# Patient Record
Sex: Female | Born: 1965 | Race: White | Hispanic: No | Marital: Single | Smoking: Never smoker
Health system: Southern US, Community
[De-identification: ages and names within clinical notes are randomized; demographics above are authoritative.]

## PROBLEM LIST (undated history)

## (undated) DIAGNOSIS — F329 Major depressive disorder, single episode, unspecified: Secondary | ICD-10-CM

## (undated) DIAGNOSIS — F32A Depression, unspecified: Secondary | ICD-10-CM

---

## 1999-07-13 ENCOUNTER — Emergency Department (HOSPITAL_COMMUNITY): Admission: EM | Admit: 1999-07-13 | Discharge: 1999-07-13 | Payer: Self-pay | Admitting: Emergency Medicine

## 2000-07-16 ENCOUNTER — Other Ambulatory Visit: Admission: RE | Admit: 2000-07-16 | Discharge: 2000-07-16 | Payer: Self-pay | Admitting: *Deleted

## 2001-08-11 ENCOUNTER — Other Ambulatory Visit: Admission: RE | Admit: 2001-08-11 | Discharge: 2001-08-11 | Payer: Self-pay | Admitting: Obstetrics and Gynecology

## 2002-09-27 ENCOUNTER — Other Ambulatory Visit: Admission: RE | Admit: 2002-09-27 | Discharge: 2002-09-27 | Payer: Self-pay | Admitting: Obstetrics and Gynecology

## 2009-03-09 ENCOUNTER — Emergency Department (HOSPITAL_COMMUNITY): Admission: EM | Admit: 2009-03-09 | Discharge: 2009-03-09 | Payer: Self-pay | Admitting: Emergency Medicine

## 2009-12-21 ENCOUNTER — Emergency Department (HOSPITAL_COMMUNITY): Admission: EM | Admit: 2009-12-21 | Discharge: 2009-12-21 | Payer: Self-pay | Admitting: Emergency Medicine

## 2010-02-01 ENCOUNTER — Emergency Department (HOSPITAL_COMMUNITY): Admission: EM | Admit: 2010-02-01 | Discharge: 2010-02-01 | Payer: Self-pay | Admitting: Emergency Medicine

## 2010-10-14 LAB — COMPREHENSIVE METABOLIC PANEL
ALT: 30 U/L (ref 0–35)
AST: 25 U/L (ref 0–37)
Alkaline Phosphatase: 62 U/L (ref 39–117)
Creatinine, Ser: 0.95 mg/dL (ref 0.4–1.2)
GFR calc non Af Amer: >60 mL/min (ref >60)
Glucose, Bld: 136 mg/dL — ABNORMAL HIGH (ref 70–99)
Total Protein: 7.7 g/dL (ref 6.0–8.3)

## 2010-10-14 LAB — CBC
HCT: 37.6 % (ref 36.0–46.0)
Hemoglobin: 13.1 g/dL (ref 12.0–15.0)
MCV: 87.2 fL (ref 78.0–100.0)
Platelets: 168 10*3/uL (ref 150–400)
RBC: 4.31 MIL/uL (ref 3.87–5.11)
WBC: 21.7 10*3/uL — ABNORMAL HIGH (ref 4.0–10.5)

## 2010-10-14 LAB — URINE MICROSCOPIC-ADD ON

## 2010-10-14 LAB — URINALYSIS, ROUTINE W REFLEX MICROSCOPIC
Ketones, ur: >80 mg/dL — AB
Protein, ur: 100 mg/dL — AB
Specific Gravity, Urine: 1.026 (ref 1.005–1.030)
pH: 5.5 (ref 5.0–8.0)

## 2010-10-14 LAB — URINE CULTURE

## 2010-10-14 LAB — DIFFERENTIAL
Basophils Absolute: 0 10*3/uL (ref 0.0–0.1)
Monocytes Absolute: 1.1 10*3/uL — ABNORMAL HIGH (ref 0.1–1.0)
Neutro Abs: 19.9 10*3/uL — ABNORMAL HIGH (ref 1.7–7.7)
Neutrophils Relative %: 92 % — ABNORMAL HIGH (ref 43–77)

## 2010-10-14 LAB — PREGNANCY, URINE: Preg Test, Ur: NEGATIVE

## 2011-11-27 IMAGING — CT CT ABD-PELV W/O CM
1 of 2 series · 16 of 32 positions shown, 20 images · non-contrast
Comparison: None.

CLINICAL DATA: Left flank and left lower quadrant abdominal pain.
Currently being treated for urinary tract infection.  Hematuria.

CT ABDOMEN AND PELVIS WITHOUT CONTRAST
TECHNIQUE: Multidetector CT imaging of the abdomen and pelvis was
performed following the standard protocol without intravenous
contrast.

[Series 2: under 200# stone no prev · axial · 0.74mm/px · z∈[-460,-120]mm · 16 of 74 slices shown, 20 images]
[im 3/74  soft-tissue]
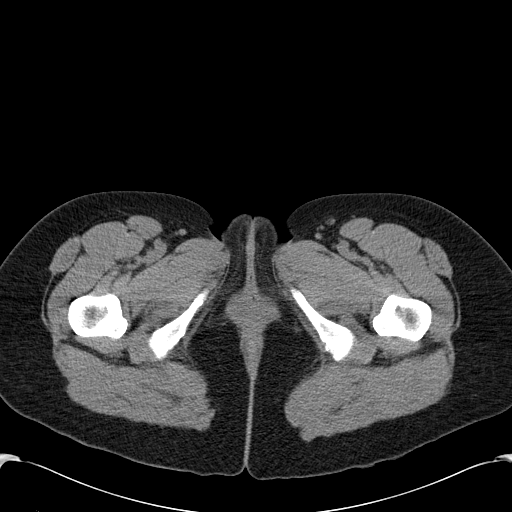
[im 3/74  bone]
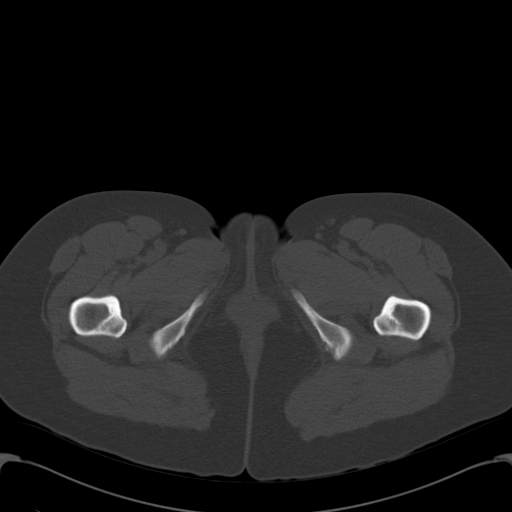
[im 9/74  soft-tissue]
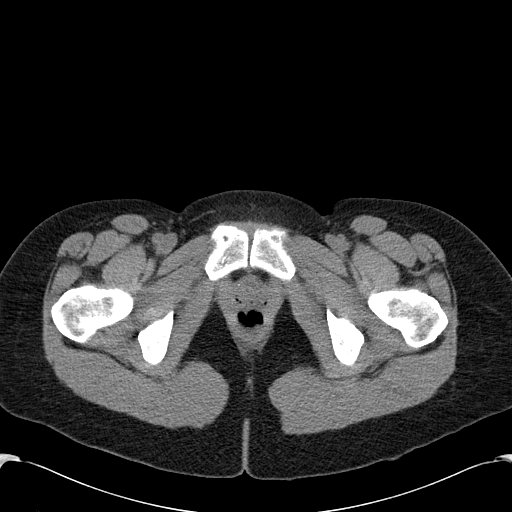
[im 15/74  soft-tissue]
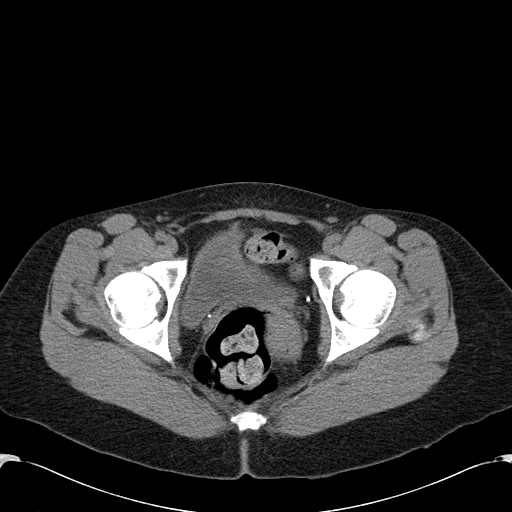
[im 21/74  soft-tissue]
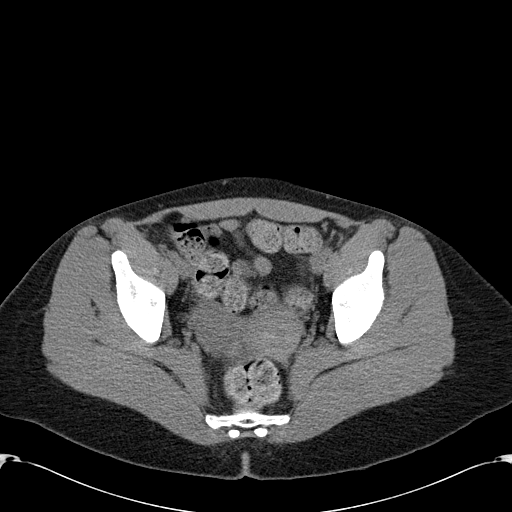
[im 24/74  soft-tissue]
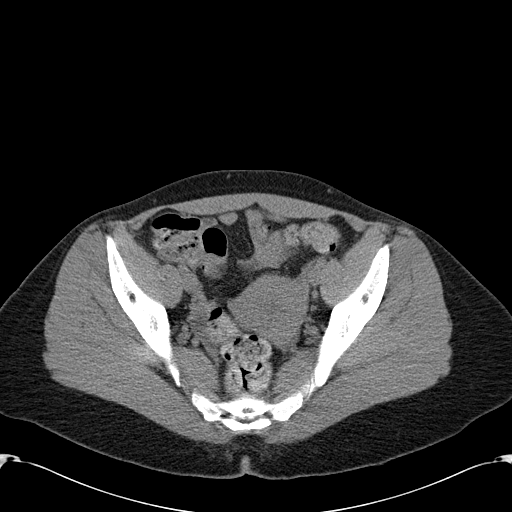
[im 30/74  soft-tissue]
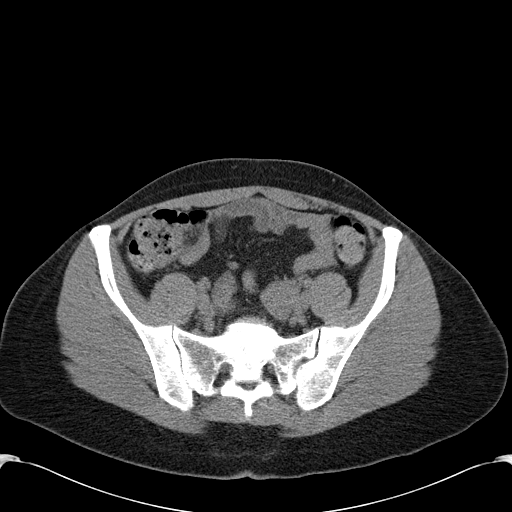
[im 36/74  soft-tissue]
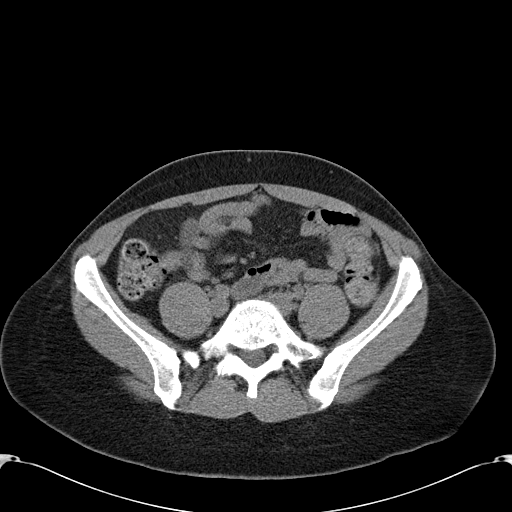
[im 38/74  soft-tissue]
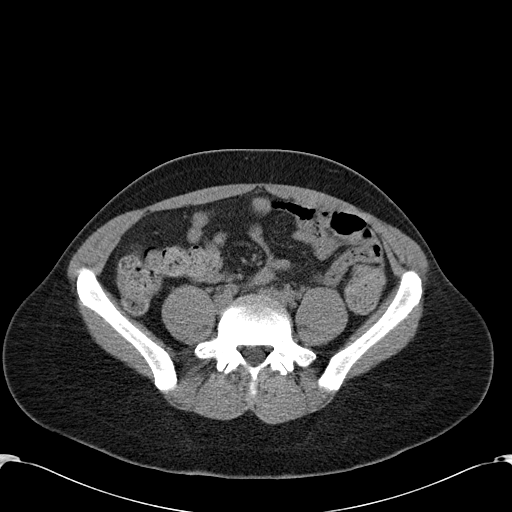
[im 44/74  soft-tissue]
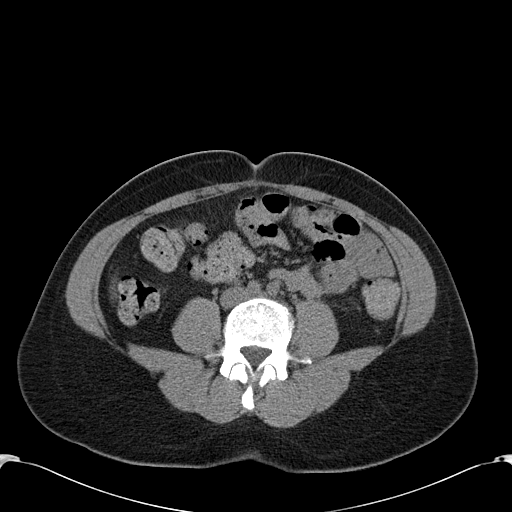
[im 44/74  bone]
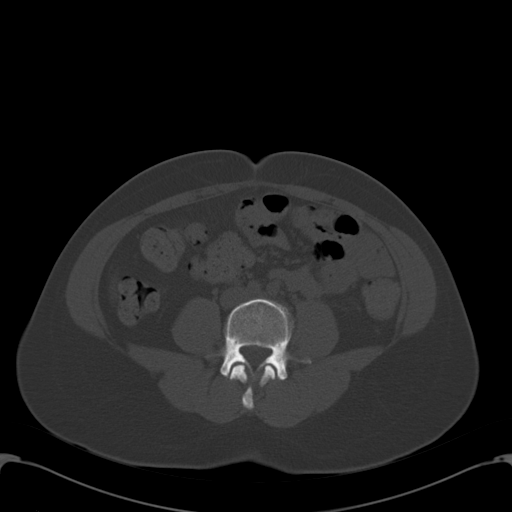
[im 50/74  soft-tissue]
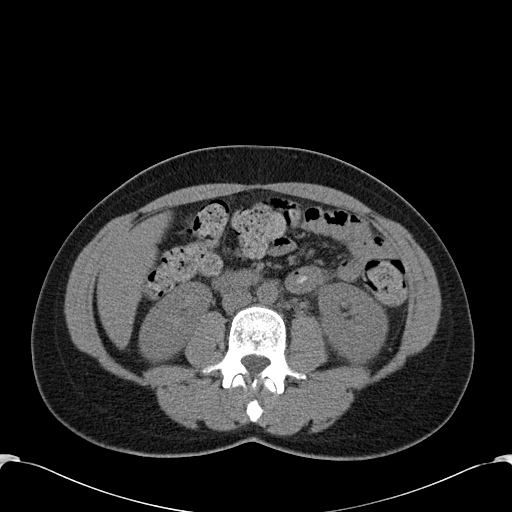
[im 56/74  soft-tissue]
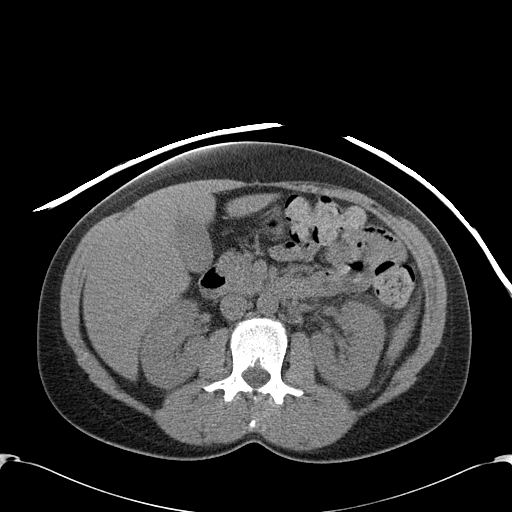
[im 59/74  soft-tissue]
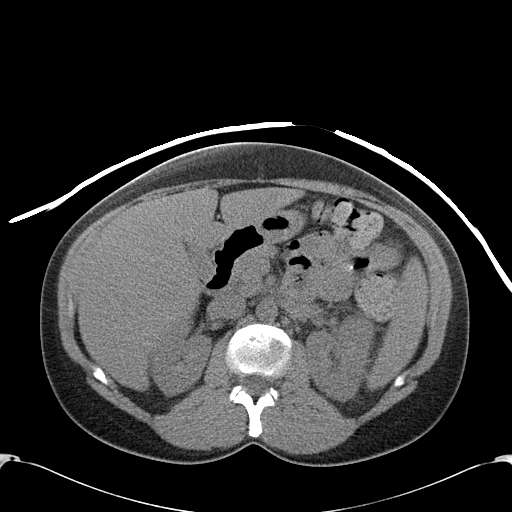
[im 62/74  lung]
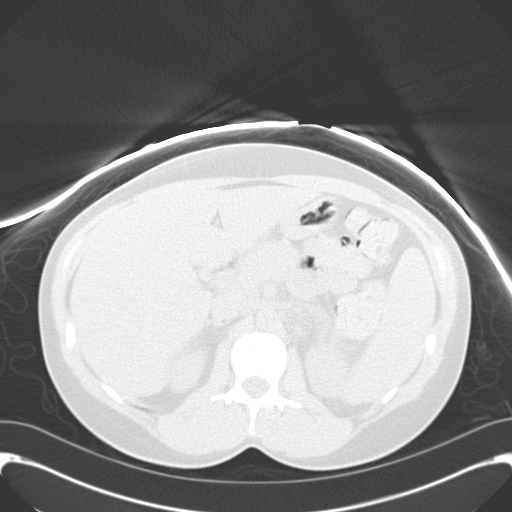
[im 65/74  soft-tissue]
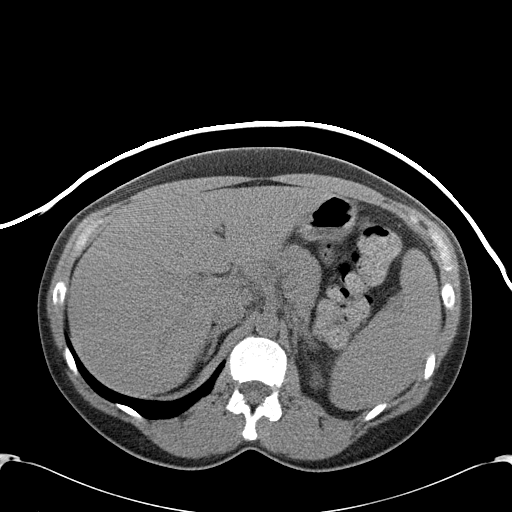
[im 65/74  lung]
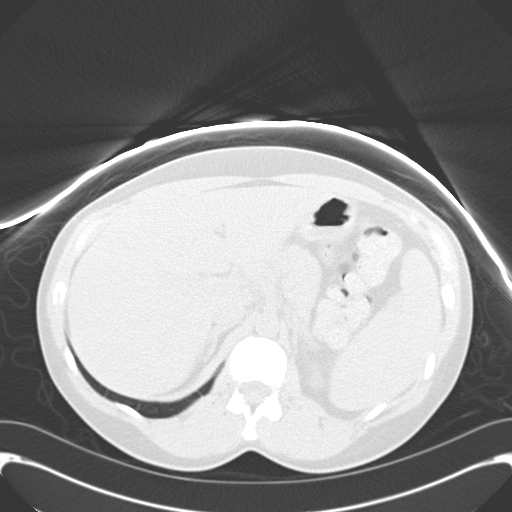
[im 68/74  lung]
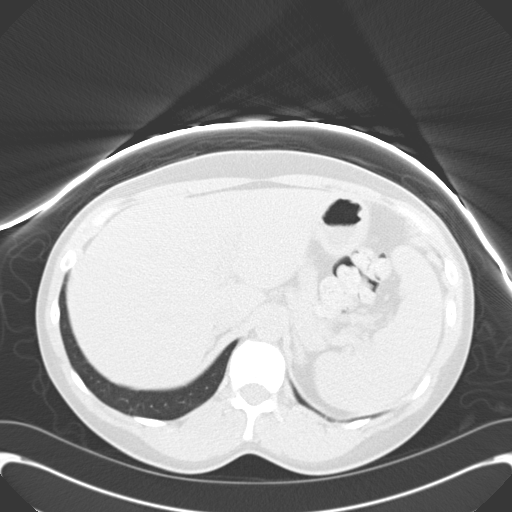
[im 71/74  soft-tissue]
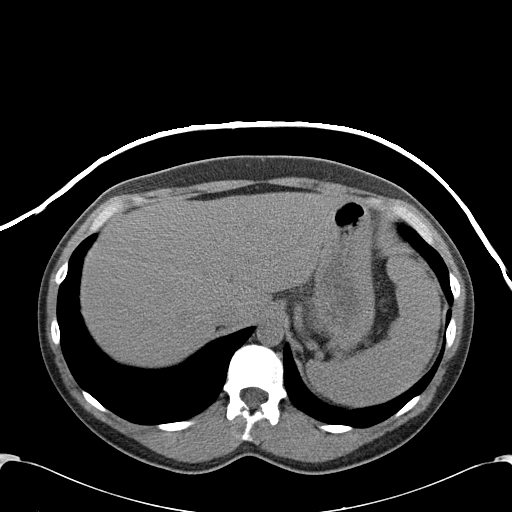
[im 71/74  lung]
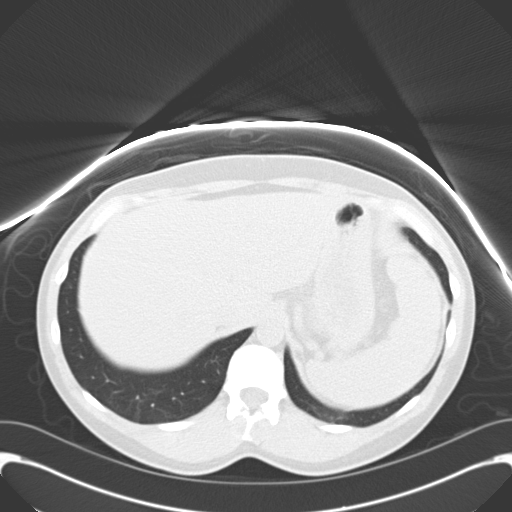

[16 of 32 positions shown; findings below may reference images not displayed]

FINDINGS: There is slight left perinephric soft tissue stranding
which could be seen with pyelonephritis.  There are no renal or
ureteral calculi.  There is no hydronephrosis.

The visualized portions of the liver and spleen are normal.  The
pancreas and adrenal glands and right kidney are normal.  There is
no adenopathy.  Uterus and ovaries are normal.  The bowel including
the terminal ileum and appendix are normal.

No bony abnormalities.
IMPRESSION: Slight soft tissue stranding around the left kidney, nonspecific,
but consistent with pyelonephritis.

Otherwise normal exam.

## 2015-03-31 ENCOUNTER — Emergency Department (HOSPITAL_COMMUNITY)
Admission: EM | Admit: 2015-03-31 | Discharge: 2015-03-31 | Disposition: A | Discharge status: Home / Self Care | Payer: Self-pay | Attending: Emergency Medicine | Admitting: Emergency Medicine

## 2015-03-31 ENCOUNTER — Encounter (HOSPITAL_COMMUNITY): Payer: Self-pay | Admitting: Emergency Medicine

## 2015-03-31 DIAGNOSIS — M545 Low back pain, unspecified: Secondary | ICD-10-CM

## 2015-03-31 DIAGNOSIS — Z3202 Encounter for pregnancy test, result negative: Secondary | ICD-10-CM | POA: Insufficient documentation

## 2015-03-31 DIAGNOSIS — Z8659 Personal history of other mental and behavioral disorders: Secondary | ICD-10-CM | POA: Insufficient documentation

## 2015-03-31 HISTORY — DX: Depression, unspecified: F32.A

## 2015-03-31 HISTORY — DX: Major depressive disorder, single episode, unspecified: F32.9

## 2015-03-31 LAB — URINALYSIS, ROUTINE W REFLEX MICROSCOPIC
BILIRUBIN URINE: NEGATIVE
Glucose, UA: NEGATIVE mg/dL
Hgb urine dipstick: NEGATIVE
KETONES UR: NEGATIVE mg/dL
Leukocytes, UA: NEGATIVE
NITRITE: NEGATIVE
PH: 6.5 (ref 5.0–8.0)
PROTEIN: NEGATIVE mg/dL
Specific Gravity, Urine: 1.011 (ref 1.005–1.030)
Urobilinogen, UA: 0.2 mg/dL (ref 0.0–1.0)

## 2015-03-31 LAB — POC URINE PREG, ED: Preg Test, Ur: NEGATIVE

## 2015-03-31 MED ORDER — CEPHALEXIN 500 MG PO CAPS: 500.0000 mg | ORAL_CAPSULE | Freq: Four times a day (QID) | ORAL | Status: DC

## 2015-03-31 NOTE — ED Notes (Signed)
Per pt, states she is having lower back pain-thought she might have had a UTI last week-no dysuria, thinks it might have progressed

## 2015-03-31 NOTE — Discharge Instructions (Signed)
°Emergency Department Resource Guide °1) Find a Doctor and Pay Out of Pocket °Although you won't have to find out who is covered by your insurance plan, it is a good idea to ask around and get recommendations. You will then need to call the office and see if the doctor you have chosen will accept you as a new patient and what types of options they offer for patients who are self-pay. Some doctors offer discounts or will set up payment plans for their patients who do not have insurance, but you will need to ask so you aren't surprised when you get to your appointment. ° °2) Contact Your Local Health Department °Not all health departments have doctors that can see patients for sick visits, but many do, so it is worth a call to see if yours does. If you don't know where your local health department is, you can check in your phone book. The CDC also has a tool to help you locate your state's health department, and many state websites also have listings of all of their local health departments. ° °3) Find a Walk-in Clinic °If your illness is not likely to be very severe or complicated, you may want to try a walk in clinic. These are popping up all over the country in pharmacies, drugstores, and shopping centers. They're usually staffed by nurse practitioners or physician assistants that have been trained to treat common illnesses and complaints. They're usually fairly quick and inexpensive. However, if you have serious medical issues or chronic medical problems, these are probably not your best option. ° °No Primary Care Doctor: °- Call [facility] at  832-8000 - they can help you locate a primary care doctor that  accepts your insurance, provides certain services, etc. °- Physician Referral Service- 1-800-533-3463 ° °Chronic Pain Problems: °Organization         Address  Phone   Notes  °[facility]  (336) 297-2271 Patients need to be referred by their primary care doctor.  ° °Medication  Assistance: °Organization         Address  Phone   Notes  °[place] [facility] [address] °[place], [place] [address] (336) 641-8030 --Must be a resident of [place] °-- Must have NO insurance coverage whatsoever (no Medicaid/ Medicare, etc.) °-- The pt. MUST have a primary care doctor that directs their care regularly and follows them in the community °  °[facility]  (866) 331-1348   °[employer]  (888) 892-1162   ° °Agencies that provide inexpensive medical care: °Organization         Address  Phone   Notes  °[facility]  (336) 832-8035   ° Internal Medicine    (336) 832-7272   °[facility] [address] °[place], [place] [address] (336) 832-4777   °[facility] [address], °[place] (336) 271-4999   °[facility]    (336) 373-0678   °[facility]    (336) 272-1050   °[facility] ° [address], [place] Phone:  (336) 832-4444, Fax:  (336) 832-4440 Hours of Operation:  9 am - 6 pm, M-F.  Also accepts Medicaid/Medicare and self-pay.  °[facility] ° [address], [place] Phone: (336) 832-3150, Fax: (336) 832-3151. Hours of Operation:  8:30 am - 5:30 pm, M-F.  Also accepts Medicaid and self-pay.  °[facility] [place] [address], [place] Phone: (336) 878-6027   °[facility] [address], [place], [place] (336)723-1848, Ext. 123 Mondays & Thursdays: 7-9 AM.  First 15 patients are seen on a first come, first serve basis. °  ° °Medicaid-accepting [place] Providers: ° °Organization         Address  Phone   Notes  °[facility] [address], [place] (336) 641-2100 Also accepts self-pay patients.  °[facility] [address], [place] ° (336) 856-9996   °[facility] [address], [place]  (336) 288-8857   °[facility] [address] [place] [address], [place] (336) 299-7000   °Veita Bland [address], [place]  ° (336) 373-1557 Only accepts [place] Access Medicaid patients after they have their name applied to their card.  ° °Self-Pay (no insurance) in [place]: ° °Organization         Address  Phone   Notes  °Sickle Cell Patients, [facility] [address], [place] (336) 832-1970   °[facility] [address], [place] (336) 832-4400   °[facility] ° [address], [place] (336) 992-4800   °[facility]/Dr. Osei-Bonsu ° [address] [place] [address], [place] or [address], [place] (336) 841-8500 Phone number for both [place] and [place] locations is the same.  °[facility] [address], [place] (336) 299-0000   °[facility] [address] [place] [address], [place] or [address] (336) 852-7530 °(336) 878-2260   °[facility] [address], [place] (336) 350-1642, phone; (336) 294-5005, fax Sees patients 1st and 3rd Saturday of every month.  Must not qualify for public or private insurance (i.e. Medicaid, Medicare, [place] Health Choice, Veterans' Benefits) • Household income should be no more than 200% of the poverty level •The clinic cannot treat you if you are pregnant or think you are pregnant • Sexually transmitted diseases are not treated at the clinic.  ° ° °Dental Care: °Organization         Address  Phone  Notes  °[place] [facility] [address], [place] (336) 641-6152 Accepts children up to age 21 who are enrolled in Medicaid or [place] Health Choice; pregnant women with a Medicaid card; and children who have applied for Medicaid or [place] Health Choice, but were declined, whose parents can pay a reduced fee at time of service.  °[place]  [facility] [place]  [address], [place] (336) 641-7733 Accepts children up to age 21 who are enrolled in Medicaid or [place] Health Choice; pregnant women with a Medicaid card; and children who have applied for Medicaid or  Health Choice, but were declined, whose parents can pay a reduced fee at time of service.  °[facility] ° [address], [place] (336) 641-4533 Patients are seen by appointment only. Walk-ins are not accepted. [facility] will see patients 18 years of age and older. °Monday - Tuesday (8am-5pm) °Most Wednesdays (8:30-5pm) °$30 per visit, cash only  °[facility] ° [address], [place] (336) 641-4533 Patients are seen by appointment only. Walk-ins are not accepted. [facility] will see patients 18 years of age and older. °One   Wednesday Evening (Monthly: Volunteer Based).  $30 per visit, cash only  °[facility]  (919) 537-3737 for adults; Children under age 4, call Graduate Pediatric Dentistry at (919) [phone]. Children aged 4-14, please call (919) [phone] to request a pediatric application. ° Dental services are provided in all areas of dental care including fillings, crowns and bridges, complete and partial dentures, implants, gum treatment, root canals, and extractions. Preventive care is also provided. Treatment is provided to both adults and children. °Patients are selected via a lottery and there is often a waiting list. °  °[facility] [address], °[place] ° (336) 763-8833 [email or web address] °  °[facility] [address], [place], [place] (336)723-1848, Ext. 123 Second and Fourth Thursday of each month, opens at 6:30 AM; Clinic ends at 9 AM.  Patients are seen on a first-come first-served basis, and a limited number are seen during each clinic.  ° °[facility] ° [address], [place], [place] (336) 723-7904    Eligibility Requirements °You must have lived in [place], [place], or [place] counties for at least the last three months. °  You cannot be eligible for state or federal sponsored healthcare insurance, including Veterans Administration, Medicaid, or Medicare. °  You generally cannot be eligible for healthcare insurance through your employer.  °  How to apply: °Eligibility screenings are held every Tuesday and Wednesday afternoon from 1:00 pm until 4:00 pm. You do not need an appointment for the interview!  °[facility] [address], [place], [place] 336-631-2330   °[facility]  336-342-8273   °[facility]  336-703-3100   °[facility]  336-570-6415   ° °Behavioral Health Resources in the Community: °Intensive Outpatient Programs °Organization         Address  Phone  Notes  °[facility] [address], [place], [place] 336-878-6098   °[facility] [address], [place], [place] 336-832-9800   °[facility] [address], [place], [place] ° 336-882-2125   °[facility] [address],  °[place], [place] 1-800-853-5163 or 336-641-4981   °Substance Abuse Resources °Organization         Address  Phone  Notes  °[facility]  336-882-2125   °[facility]  336-784-9470   °[facility]  336-285-9073   °[facility]  336-845-3988   °Residential & Outpatient Substance Abuse Program  1-800-659-3381   °Psychological Services °Organization         Address  Phone  Notes  °[facility]  336- 832-9600   °[facility]  336- 378-7881   °[facility] [address], [place] 1-800-853-5163 or 336-641-4981   ° °Mobile Crisis Teams °Organization         Address  Phone  Notes  °[facility], Mobile Crisis Care Unit  1-877-626-1772   °Assertive °[facility] ° [address]  [place], [place] 336-834-9664   °Sharon DeEsch [address] °[place] 336-554-5454   ° °Self-Help/Support Groups °Organization         Address  Phone             Notes  °[facility] - variety of support groups  336- 373-1402 Call for more information  °Narcotics Anonymous (NA), [facility] [address], °[place]  2 meetings at this location  ° °  Residential Treatment Programs °Organization         Address  Phone  Notes  °[facility] [address],    °[place]  1-866-801-8205   °[facility] ° [address], [place], [place] 704-293-8524   °[facility] [address], [place] 336-845-3988 Admissions: 8am-3pm M-F  °[facility] [address]    °[place], [place] 336-841-1104   °[facility] [address], [place], [place] 336-379-7146   °[facility] [address]  °[place], [place] 336-285-9073   °[facility] - Intensive Outpatient [address], [place], [place] 336-852-3033   °[facility] [address]  °[place], [place] 1-877-615-2722 or 336-784-9470   °[facility] [address] [place], [place] 336-227-7417 Accepts Medicaid  °[facility] [address]  °[place] 1-800-659-3381 Substance Abuse/Addiction Treatment  ° °[place] Behavioral Health Resources °Organization         Address  Phone  Notes  °[facility]  (888) 581-9988   °Julie Brannon, PhD [address] [place], [place]   (336) 349-5553 or (336) 951-0000   °[facility]   [address] °[place], [place] (336) 349-4454   °[facility] [address], [place], [place] (336) 342-8316 Insurance/Medicaid/sponsorship through [facility]  °[facility] [address]                                    [place], [place] (336) 342-8316 Therapy/tele-psych/case    °[facility] [address]  ° [place],  (336) 349-2233    °Dr. Arfeen  (336) 349-4544   °[facility] [place]  [employer] [place] [facility] 1) [address], [place] °2) [address], [place] °3)  [address], [place] (336) 349-3220 °(336) 342-7768 ° °(336) 342-8140   °[place] [facility] (336) 342-1394 or (336) 342-3537 (After Hours)    ° ° °

## 2015-03-31 NOTE — ED Provider Notes (Signed)
CSN: [id number]     Arrival date & time 03/31/15  1429 History  This chart was scribed for non-physician practitioner, Haynes Dage, PA-C, working with Lorre Nick, MD, by Budd Palmer ED Scribe. This patient was seen in room WTR5/WTR5 and the patient's care was started at 3:07 PM     Chief Complaint  Patient presents with  . back pain/UTI    The history is provided by the patient. No language interpreter was used.   HPI Comments: Lindsey Wiggins is a 49 y.o. female who presents to the Emergency Department complaining of lower back pain onset 1 day ago, as well as expressing concerns for a possible UTI onset 1 week ago. She reports associated dysuria and an abdominal bloating sensation. She states she has been drinking a lot of water recently, which seemed to resolve the symptoms for a time. She has a PMHx of 3 UTIs. Pt states she is going through menopause and had her LNMP in June, and before that in January of this year. Pt denies n/v, fever, hematuria, and vaginal discharge/odor, vaginal bleeding, pelvic pain, or abdominal pain, bowel or bladder incontinence, recent prednisone use, IV drug use, history of cancer.  Past Medical History  Diagnosis Date  . Depression    History reviewed. No pertinent past surgical history. No family history on file. Social History  Substance Use Topics  . Smoking status: Never Smoker   . Smokeless tobacco: None  . Alcohol Use: No   OB History    No data available     Review of Systems  Constitutional: Negative for fever.  Gastrointestinal: Negative for nausea and vomiting.  Genitourinary: Positive for dysuria. Negative for hematuria and vaginal discharge.  Musculoskeletal: Negative for back pain.  All other systems reviewed and are negative.   Allergies  Review of patient's allergies indicates not on file.  Home Medications   Prior to Admission medications   Medication Sig Start Date End Date Taking? Authorizing Provider   cephALEXin (KEFLEX) 500 MG capsule Take 1 capsule (500 mg total) by mouth 4 (four) times daily. 03/31/15   Tevion Laforge Patel-Mills, PA-C   BP 128/70 mmHg  Pulse 85  Temp(Src) 98.2 F (36.8 C) (Oral)  Resp 16  SpO2 100% Physical Exam  Constitutional: She is oriented to person, place, and time. She appears well-developed and well-nourished.  HENT:  Head: Normocephalic and atraumatic.  Eyes: Conjunctivae are normal.  Neck: Normal range of motion.  Cardiovascular: Normal rate, regular rhythm and normal heart sounds.   Pulmonary/Chest: Effort normal and breath sounds normal. No respiratory distress.  Abdominal: Soft. She exhibits no distension. There is no tenderness. There is no rebound, no guarding and no CVA tenderness.  No CVA tenderness to palpation. No abdominal or pelvic tenderness to palpation. No guarding or rebound.  She is well-appearing and in no acute distress.  Musculoskeletal: Normal range of motion.  No reproducible back tenderness to palpation. No midline lumbar vertebral tenderness. No saddle anesthesia or lower extremity weakness. Ambulatory with steady gait.  Neurological: She is alert and oriented to person, place, and time. Coordination normal.  Skin: Skin is warm and dry. No rash noted. She is not diaphoretic. No erythema.  Psychiatric: She has a normal mood and affect.  Nursing note and vitals reviewed.   ED Course  Procedures  DIAGNOSTIC STUDIES: Oxygen Saturation is 100% on RA, normal by my interpretation.    COORDINATION OF CARE: 3:11 PM - Discussed plans to wait on diagnostic studies to r/o  possible UTI. Pt advised of plan for treatment and pt agrees.  Labs Review Labs Reviewed  URINALYSIS, ROUTINE W REFLEX MICROSCOPIC (NOT AT [facility]) - Abnormal; Notable for the following:    APPearance CLOUDY (*)    All other components within normal limits  URINE CULTURE  POC URINE PREG, ED    Imaging Review No results found. I have personally reviewed and evaluated  these lab results as part of my medical decision-making.   EKG Interpretation None      MDM   Final diagnoses:  Bilateral low back pain without sciatica  Patient presents for back pain and dysuria. Vitals are stable and she is well-appearing.urinalysis is negative for UTI or pregnancy.Urine culture pending. I have given the patient a prescription for Keflex and she stated that sometimes she has a negative urine and ends up having a UTI. She now states that she does not have dysuria so I will not treat with Pyridium.I am not sure if this is musculoskeletal lower back pain versus a urinary tract infection. I do not suspect AAA. I discussed following up with her primary care physician.I gave the patient return precautions such as fever, increased back pain, dysuria, hematuria, or urinary frequency. Patient verbally agrees with the plan.  I personally performed the services described in this documentation, which was scribed in my presence. The recorded information has been reviewed and is accurate.   Catha Gosselin, PA-C 03/31/15 1606  Lorre Nick, MD 04/07/15 [phone]

## 2015-04-02 LAB — URINE CULTURE
Culture: NO GROWTH
Special Requests: NORMAL

## 2015-08-23 ENCOUNTER — Other Ambulatory Visit: Payer: Self-pay | Admitting: Family Medicine

## 2015-08-23 DIAGNOSIS — E288 Other ovarian dysfunction: Secondary | ICD-10-CM

## 2015-10-18 ENCOUNTER — Other Ambulatory Visit: Payer: Self-pay

## 2015-10-18 ENCOUNTER — Other Ambulatory Visit: Payer: Managed Care, Other (non HMO)

## 2019-10-19 ENCOUNTER — Encounter (HOSPITAL_COMMUNITY): Payer: Self-pay

## 2019-10-19 ENCOUNTER — Ambulatory Visit (HOSPITAL_COMMUNITY)
Admission: EM | Admit: 2019-10-19 | Discharge: 2019-10-19 | Disposition: A | Discharge status: Home / Self Care | Payer: 59 | Attending: Urgent Care | Admitting: Urgent Care

## 2019-10-19 ENCOUNTER — Other Ambulatory Visit: Payer: Self-pay

## 2019-10-19 DIAGNOSIS — M545 Low back pain, unspecified: Secondary | ICD-10-CM

## 2019-10-19 DIAGNOSIS — R8290 Unspecified abnormal findings in urine: Secondary | ICD-10-CM | POA: Insufficient documentation

## 2019-10-19 DIAGNOSIS — Z87828 Personal history of other (healed) physical injury and trauma: Secondary | ICD-10-CM | POA: Insufficient documentation

## 2019-10-19 DIAGNOSIS — Z8739 Personal history of other diseases of the musculoskeletal system and connective tissue: Secondary | ICD-10-CM | POA: Diagnosis present

## 2019-10-19 LAB — BASIC METABOLIC PANEL
Anion gap: 14 (ref 5–15)
BUN: 13 mg/dL (ref 6–20)
CO2: 26 mmol/L (ref 22–32)
Calcium: 9.7 mg/dL (ref 8.9–10.3)
Chloride: 100 mmol/L (ref 98–111)
Creatinine, Ser: 1.12 mg/dL — ABNORMAL HIGH (ref 0.44–1.00)
GFR calc Af Amer: >60 mL/min (ref >60)
GFR calc non Af Amer: 56 mL/min — ABNORMAL LOW (ref >60)
Glucose, Bld: 92 mg/dL (ref 70–99)
Potassium: 4.1 mmol/L (ref 3.5–5.1)
Sodium: 140 mmol/L (ref 135–145)

## 2019-10-19 LAB — POCT URINALYSIS DIP (DEVICE)
Bilirubin Urine: NEGATIVE
Glucose, UA: NEGATIVE mg/dL
Hgb urine dipstick: NEGATIVE
Ketones, ur: NEGATIVE mg/dL
Leukocytes,Ua: NEGATIVE
Nitrite: NEGATIVE
Protein, ur: NEGATIVE mg/dL
Specific Gravity, Urine: 1.01 (ref 1.005–1.030)
Urobilinogen, UA: 0.2 mg/dL (ref 0.0–1.0)
pH: 6.5 (ref 5.0–8.0)

## 2019-10-19 LAB — CBC
HCT: 39.8 % (ref 36.0–46.0)
Hemoglobin: 13.2 g/dL (ref 12.0–15.0)
MCH: 29.2 pg (ref 26.0–34.0)
MCHC: 33.2 g/dL (ref 30.0–36.0)
MCV: 88.1 fL (ref 80.0–100.0)
Platelets: 215 10*3/uL (ref 150–400)
RBC: 4.52 MIL/uL (ref 3.87–5.11)
RDW: 11.9 % (ref 11.5–15.5)
WBC: 6.3 10*3/uL (ref 4.0–10.5)
nRBC: 0 % (ref 0.0–0.2)

## 2019-10-19 MED ORDER — MELOXICAM 7.5 MG PO TABS: 7.5000 mg | ORAL_TABLET | Freq: Every day | ORAL | 0 refills | Status: DC

## 2019-10-19 MED ORDER — TIZANIDINE HCL 4 MG PO TABS: 4.0000 mg | ORAL_TABLET | Freq: Three times a day (TID) | ORAL | 0 refills | Status: DC | PRN

## 2019-10-19 NOTE — ED Provider Notes (Addendum)
[facility]   MRN: [id number] DOB: Apr 25, 1966  Subjective:   Lindsey Wiggins is a 54 y.o. female presenting for persistent cloudy urine, ongoing mid right-sided low back pain.  Patient had urinalysis and urine culture completed about 1 month ago and was completely normal. Patient has taken a complete course of Bactrim.  She states that she is very concerned about her kidney given persistent cloudy urine.  She has a history of a severe back injury, back fracture and has had chronic back pain.  However, she states that this pain is coming from her kidney and wants work-up for this.  Back pain is worse when she rotates to the left.  Denies fever, dysuria, urinary frequency, hematuria, recent trauma or fall, weakness.  Denies taking chronic medications.    Allergies  Allergen Reactions  . Alpha-Gal     Past Medical History:  Diagnosis Date  . Depression      History reviewed. No pertinent surgical history.  Family History  Family history unknown: Yes    Social History   Tobacco Use  . Smoking status: Never Smoker  Substance Use Topics  . Alcohol use: No  . Drug use: No    ROS   Objective:   Vitals: BP 127/74 (BP Location: Left Arm)   Pulse 76   Temp 98.8 F (37.1 C) (Oral)   Resp 16   SpO2 100%   Physical Exam Constitutional:      General: She is not in acute distress.    Appearance: Normal appearance. She is well-developed. She is not ill-appearing, toxic-appearing or diaphoretic.  HENT:     Head: Normocephalic and atraumatic.     Nose: Nose normal.     Mouth/Throat:     Mouth: Mucous membranes are moist.     Pharynx: Oropharynx is clear.  Eyes:     General: No scleral icterus.    Extraocular Movements: Extraocular movements intact.     Pupils: Pupils are equal, round, and reactive to light.  Cardiovascular:     Rate and Rhythm: Normal rate.  Pulmonary:     Effort: Pulmonary effort is normal.  Abdominal:     General: Bowel sounds are normal.  There is no distension.     Palpations: Abdomen is soft. There is no mass.     Tenderness: There is no abdominal tenderness. There is no right CVA tenderness, left CVA tenderness, guarding or rebound.     Hernia: No hernia is present.  Musculoskeletal:     Lumbar back: Tenderness present. No swelling, edema, deformity, signs of trauma, lacerations, spasms or bony tenderness. Normal range of motion. Negative right straight leg raise test and negative left straight leg raise test. No scoliosis.       Back:  Skin:    General: Skin is warm and dry.  Neurological:     General: No focal deficit present.     Mental Status: She is alert and oriented to person, place, and time.  Psychiatric:        Mood and Affect: Mood normal.        Behavior: Behavior normal.        Thought Content: Thought content normal.        Judgment: Judgment normal.    Results for orders placed or performed during the hospital encounter of 10/19/19 (from the past 24 hour(s))  POCT urinalysis dip (device)     Status: None   Collection Time: 10/19/19  3:05 PM  Result Value Ref  Range   Glucose, UA NEGATIVE NEGATIVE mg/dL   Bilirubin Urine NEGATIVE NEGATIVE   Ketones, ur NEGATIVE NEGATIVE mg/dL   Specific Gravity, Urine 1.010 1.005 - 1.030   Hgb urine dipstick NEGATIVE NEGATIVE   pH 6.5 5.0 - 8.0   Protein, ur NEGATIVE NEGATIVE mg/dL   Urobilinogen, UA 0.2 0.0 - 1.0 mg/dL   Nitrite NEGATIVE NEGATIVE   Leukocytes,Ua NEGATIVE NEGATIVE   Assessment and Plan :   1. Cloudy urine   2. Acute right-sided low back pain without sciatica   3. History of back pain   4. History of back injury     Discussed etiologies for patient's symptoms but have high suspicion that her pain is musculoskeletal likely sequelae from her history of back fracture, chronic back pain.  Patient has significant concern about her kidney but there is no CVA tenderness or abdominal pain on exam.  Try to reassure patient but she insisted on more  work-up, obliged her with basic metabolic panel, CBC.  Also informed patient that I would only follow-up with her if her results are abnormal.  Encouraged her to sign up for my chart to see her lab results.  Provided her with information to [facility] so that she can establish care with a new primary care provider.  In the meantime, recommended she use meloxicam and Zanaflex for musculoskeletal back pain.  Continue daily adequate hydration. Counseled patient on potential for adverse effects with medications prescribed/recommended today, ER and return-to-clinic precautions discussed, patient verbalized understanding.    Jaynee Eagles, PA-C 10/19/19 1611   Results for orders placed or performed during the hospital encounter of 10/19/19 (from the past 24 hour(s))  POCT urinalysis dip (device)     Status: None   Collection Time: 10/19/19  3:05 PM  Result Value Ref Range   Glucose, UA NEGATIVE NEGATIVE mg/dL   Bilirubin Urine NEGATIVE NEGATIVE   Ketones, ur NEGATIVE NEGATIVE mg/dL   Specific Gravity, Urine 1.010 1.005 - 1.030   Hgb urine dipstick NEGATIVE NEGATIVE   pH 6.5 5.0 - 8.0   Protein, ur NEGATIVE NEGATIVE mg/dL   Urobilinogen, UA 0.2 0.0 - 1.0 mg/dL   Nitrite NEGATIVE NEGATIVE   Leukocytes,Ua NEGATIVE NEGATIVE  Basic metabolic panel     Status: Abnormal   Collection Time: 10/19/19  3:41 PM  Result Value Ref Range   Sodium 140 135 - 145 mmol/L   Potassium 4.1 3.5 - 5.1 mmol/L   Chloride 100 98 - 111 mmol/L   CO2 26 22 - 32 mmol/L   Glucose, Bld 92 70 - 99 mg/dL   BUN 13 6 - 20 mg/dL   Creatinine, Ser 1.12 (H) 0.44 - 1.00 mg/dL   Calcium 9.7 8.9 - 10.3 mg/dL   GFR calc non Af Amer 56 (L) >60 mL/min   GFR calc Af Amer >60 >60 mL/min   Anion gap 14 5 - 15  CBC     Status: None   Collection Time: 10/19/19  3:41 PM  Result Value Ref Range   WBC 6.3 4.0 - 10.5 K/uL   RBC 4.52 3.87 - 5.11 MIL/uL   Hemoglobin 13.2 12.0 - 15.0 g/dL   HCT 39.8 36.0 - 46.0 %   MCV 88.1  80.0 - 100.0 fL   MCH 29.2 26.0 - 34.0 pg   MCHC 33.2 30.0 - 36.0 g/dL   RDW 11.9 11.5 - 15.5 %   Platelets 215 150 - 400 K/uL   nRBC 0.0 0.0 - 0.2 %  Called patient and left detailed voicemail reporting slight increase in creatinine level, slight decrease in GFR. Explained that this may be a source of cloudy urine in the morning but is not very specific. Reported normal cbc which is not in line with pyelonephritis. She may have a urinary stone that is causing this or other renal source of her symptoms. At this time, due to normal vitals, benign exam did not recommend going to ER. However, provided her with ER precautions and is otherwise to f/u with a new PCP through [facility]. Recommended she hold off on using NSAIDS, use APAP and Zanaflex. Counseled patient on potential for adverse effects with medications prescribed/recommended today, ER and return-to-clinic precautions discussed, patient verbalized understanding.    Wallis Bamberg, [place] 10/19/19 1737

## 2019-10-19 NOTE — ED Triage Notes (Signed)
Pt presents with bilateral flank pain & cloudy urine for over a week; pt states she just finished antibiotics but is still feeling symptoms.

## 2019-10-19 NOTE — Discharge Instructions (Addendum)
Continue to hydrate well. I do believe your pain is musculoskeletal. Using an NSAID like meloxicam and a muscle relaxant would be good especially given your back history. If your results are completely normal then I recommend you follow up with a primary care provider. I encourage you to sign up for mychart to be able to see your results and messages regarding your results and general recommendations.

## 2019-10-20 LAB — URINE CULTURE: Culture: NO GROWTH

## 2019-10-28 ENCOUNTER — Other Ambulatory Visit: Payer: Self-pay

## 2019-10-28 ENCOUNTER — Ambulatory Visit (INDEPENDENT_AMBULATORY_CARE_PROVIDER_SITE_OTHER): Payer: 59 | Admitting: Internal Medicine

## 2019-10-28 ENCOUNTER — Encounter: Payer: Self-pay | Admitting: Internal Medicine

## 2019-10-28 VITALS — BP 125/113 | HR 80 | Temp 98.1°F | Ht 65.0 in | Wt 168.6 lb

## 2019-10-28 DIAGNOSIS — Z8781 Personal history of (healed) traumatic fracture: Secondary | ICD-10-CM | POA: Diagnosis not present

## 2019-10-28 DIAGNOSIS — Z87440 Personal history of urinary (tract) infections: Secondary | ICD-10-CM

## 2019-10-28 DIAGNOSIS — Z87448 Personal history of other diseases of urinary system: Secondary | ICD-10-CM

## 2019-10-28 DIAGNOSIS — M5489 Other dorsalgia: Secondary | ICD-10-CM

## 2019-10-28 LAB — POCT URINALYSIS DIPSTICK
Bilirubin, UA: NEGATIVE
Glucose, UA: NEGATIVE
Ketones, UA: NEGATIVE
Leukocytes, UA: NEGATIVE
Nitrite, UA: NEGATIVE
Protein, UA: NEGATIVE
Spec Grav, UA: 1.015 (ref 1.010–1.025)
Urobilinogen, UA: 0.2 E.U./dL
pH, UA: 6.5 (ref 5.0–8.0)

## 2019-10-28 NOTE — Assessment & Plan Note (Signed)
Lower back pain: Pyelonephritis 2011, 3 further evaluations in our system since then for low back pain/rule out UTI with negative UA and urine culture results. Two evaluations negative for UTI within the last two months.  No history of renal stones.  Urinalysis done at [facility] negative for UTI  two weeks ago however before this her friend who is a physician wrote her a 12 day course of bactrim.  She reports currently she is doing well but still reports cloudy urine.  No symptoms of urinary frequency or dysuria.  She did have some L2 and T12 fractures per pt in her back from [employer] from a fall during volleyball practice but no recent trauma.  She did get some mild benefit from zanaflex and was told not to take meloxicam but to take tylenol instead which has helped.  Difficulty sitting up in the morning, she does feel that her muscles are weaker since becoming more sedentary.  Her main complaint today is cloudy urine she feels is related to renal dysfunction and or persistent UTI.  Pain at this point is very minimal and her main concern today is the cloudy urine.  Plan: repeat formal urinalysis here, repeat bmp to check renal function, encouraged her to continue hydrating.  Treat lower back pain conservatively with tylenol, muscle relaxers PRN.

## 2019-10-28 NOTE — Progress Notes (Signed)
New Patient Office Visit  Subjective:  Patient ID: Lindsey Wiggins, female    DOB: 29-Apr-1966  Age: 54 y.o. MRN: [id number]  CC:  Chief Complaint  Patient presents with  . Follow-up    URGENT CARE FOLLOW UP / UTI  / KIDNEY PROBLEMS  . Medication Refill    MEDICATION REFILL    HPI Lindsey Wiggins presents today to establish with the [facility] clinic and for follow up for history of lower back pain in the setting of recurrent UTI and history of pyelonephritis in 2011.      Past Medical History:  Diagnosis Date  . Depression     History reviewed. No pertinent surgical history.  Family History  Family history unknown: Yes    Social History   Socioeconomic History  . Marital status: Single    Spouse name: Not on file  . Number of children: Not on file  . Years of education: Not on file  . Highest education level: Not on file  Occupational History  . Not on file  Tobacco Use  . Smoking status: Never Smoker  . Smokeless tobacco: Never Used  Substance and Sexual Activity  . Alcohol use: No  . Drug use: No  . Sexual activity: Not on file  Other Topics Concern  . Not on file  Social History Narrative  . Not on file   Social Determinants of Health   Financial Resource Strain:   . Difficulty of Paying Living Expenses:   Food Insecurity:   . Worried About Programme researcher, broadcasting/film/video in the Last Year:   . Barista in the Last Year:   Transportation Needs:   . Freight forwarder (Medical):   Marland Kitchen Lack of Transportation (Non-Medical):   Physical Activity:   . Days of Exercise per Week:   . Minutes of Exercise per Session:   Stress:   . Feeling of Stress :   Social Connections:   . Frequency of Communication with Friends and Family:   . Frequency of Social Gatherings with Friends and Family:   . Attends Religious Services:   . Active Member of Clubs or Organizations:   . Attends Banker Meetings:   Marland Kitchen Marital Status:   Intimate Partner Violence:   .  Fear of Current or Ex-Partner:   . Emotionally Abused:   Marland Kitchen Physically Abused:   . Sexually Abused:     ROS Review of Systems  Constitutional: Negative for activity change and appetite change.  HENT: Negative for congestion.   Eyes: Negative for discharge and itching.  Respiratory: Negative for shortness of breath.   Cardiovascular: Negative for chest pain.  Gastrointestinal: Negative for abdominal distention and abdominal pain.  Endocrine: Negative for cold intolerance.  Genitourinary: Negative for difficulty urinating and dysuria.  Musculoskeletal: Positive for back pain.  Skin: Negative for color change.  Neurological: Negative for dizziness.  Psychiatric/Behavioral: Negative for agitation.    Objective:   Today's Vitals: BP (!) 125/113 (BP Location: Left Arm, Patient Position: Sitting, Cuff Size: Normal)   Pulse 80   Temp 98.1 F (36.7 C) (Oral)   Ht 5\' 5"  (1.651 m)   Wt 168 lb 9.6 oz (76.5 kg)   SpO2 99%   BMI 28.06 kg/m   Physical Exam Constitutional:      General: She is not in acute distress.    Appearance: She is not diaphoretic.  Cardiovascular:     Rate and Rhythm: Normal rate and regular rhythm.  Heart sounds: Normal heart sounds. No murmur. No friction rub. No gallop.   Pulmonary:     Effort: Pulmonary effort is normal. No respiratory distress.     Breath sounds: Normal breath sounds. No wheezing or rales.  Chest:     Chest wall: No tenderness.  Abdominal:     General: Bowel sounds are normal. There is no distension.     Palpations: Abdomen is soft. There is no mass.     Tenderness: There is no abdominal tenderness. There is no guarding or rebound.  Musculoskeletal:        General: Tenderness (pain with rotation of the torso to the right) present.     Right lower leg: No edema.     Left lower leg: No edema.  Neurological:     Mental Status: She is alert.     Assessment & Plan:   Problem List Items Addressed This Visit      Other   Pain in  right paraspinal region - Primary    Lower back pain: Pyelonephritis 2011, 3 further evaluations in our system since then for low back pain/rule out UTI with negative UA and urine culture results. Two evaluations negative for UTI within the last two months.  No history of renal stones.  [facility]  [facility] weeks ago however before this her friend who is a physician wrote her a 12 day course of bactrim.  She reports currently she is doing well but still reports cloudy urine.  No symptoms of urinary frequency or dysuria.  She did have some L2 and T12 fractures per pt in her back from [employer] from a fall during volleyball practice but no recent trauma.  She did get some mild benefit from zanaflex and was told not to take meloxicam but to take tylenol instead which has helped.  Difficulty sitting up in the morning, she does feel that her muscles are weaker since becoming more sedentary.  Her main complaint today is cloudy urine she feels is related to renal dysfunction and or persistent UTI.  Pain at this point is very minimal and her main concern today is the cloudy urine.  Plan: repeat formal urinalysis here, repeat bmp to check renal function, encouraged her to continue hydrating.  Treat lower back pain conservatively with tylenol, muscle relaxers PRN.         Relevant Orders   BMP8+Anion Gap   POCT Urinalysis Dipstick (81002) (Completed)   Urinalysis, Complete (81001)      Outpatient Encounter Medications as of 10/28/2019  Medication Sig  . tiZANidine (ZANAFLEX) 4 MG tablet Take 1 tablet (4 mg total) by mouth every 8 (eight) hours as needed for muscle spasms.  . [DISCONTINUED] meloxicam (MOBIC) 7.5 MG tablet Take 1 tablet (7.5 mg total) by mouth daily.   No facility-administered encounter medications on file as of 10/28/2019.     Follow-up: No follow-ups on file.   Vickki Muff, MD

## 2019-10-28 NOTE — Patient Instructions (Signed)
Ms. Petruska, we will check your metabolic panel today to monitor for any changes in your renal function and check a urinalysis.  Continue to stay well hydrated like you're doing.  I will call you with the results of your lab work.

## 2019-10-29 LAB — BMP8+ANION GAP
Anion Gap: 12 mmol/L (ref 10.0–18.0)
BUN/Creatinine Ratio: 12 (ref 9–23)
BUN: 13 mg/dL (ref 6–24)
CO2: 26 mmol/L (ref 20–29)
Calcium: 9.7 mg/dL (ref 8.7–10.2)
Chloride: 102 mmol/L (ref 96–106)
Creatinine, Ser: 1.09 mg/dL — ABNORMAL HIGH (ref 0.57–1.00)
GFR calc Af Amer: 67 mL/min/{1.73_m2} (ref >59)
GFR calc non Af Amer: 59 mL/min/{1.73_m2} — ABNORMAL LOW (ref >59)
Glucose: 76 mg/dL (ref 65–99)
Potassium: 4.4 mmol/L (ref 3.5–5.2)
Sodium: 140 mmol/L (ref 134–144)

## 2019-10-29 LAB — MICROSCOPIC EXAMINATION
Bacteria, UA: NONE SEEN
Casts: NONE SEEN /lpf
Epithelial Cells (non renal): NONE SEEN /hpf (ref 0–10)
RBC: NONE SEEN /hpf (ref 0–2)
WBC, UA: NONE SEEN /hpf (ref 0–5)

## 2019-10-29 LAB — URINALYSIS, COMPLETE
Bilirubin, UA: NEGATIVE
Glucose, UA: NEGATIVE
Ketones, UA: NEGATIVE
Leukocytes,UA: NEGATIVE
Nitrite, UA: NEGATIVE
Protein,UA: NEGATIVE
RBC, UA: NEGATIVE
Specific Gravity, UA: 1.011 (ref 1.005–1.030)
Urobilinogen, Ur: 0.2 mg/dL (ref 0.2–1.0)
pH, UA: 6.5 (ref 5.0–7.5)

## 2019-11-01 NOTE — Progress Notes (Signed)
Spoke with pt about results.  She will follow up with PCP for health maintenance in about one month.

## 2019-11-01 NOTE — Progress Notes (Signed)
Internal Medicine Clinic Attending  Case discussed with Dr. Winfrey  at the time of the visit.  We reviewed the resident's history and exam and pertinent patient test results.  I agree with the assessment, diagnosis, and plan of care documented in the resident's note.  

## 2019-11-03 ENCOUNTER — Ambulatory Visit: Payer: Managed Care, Other (non HMO) | Admitting: Nurse Practitioner

## 2021-11-30 ENCOUNTER — Ambulatory Visit (HOSPITAL_COMMUNITY)
Admission: EM | Admit: 2021-11-30 | Discharge: 2021-11-30 | Disposition: A | Discharge status: Home / Self Care | Payer: Self-pay

## 2021-11-30 ENCOUNTER — Encounter (HOSPITAL_COMMUNITY): Payer: Self-pay

## 2021-11-30 DIAGNOSIS — L03317 Cellulitis of buttock: Secondary | ICD-10-CM

## 2021-11-30 MED ORDER — DOXYCYCLINE HYCLATE 100 MG PO CAPS: 100.0000 mg | ORAL_CAPSULE | Freq: Two times a day (BID) | ORAL | 0 refills | Status: DC

## 2021-11-30 MED ORDER — SULFAMETHOXAZOLE-TRIMETHOPRIM 800-160 MG PO TABS: 1.0000 | ORAL_TABLET | Freq: Two times a day (BID) | ORAL | 0 refills | Status: DC

## 2021-11-30 MED ORDER — TRIAMCINOLONE ACETONIDE 0.025 % EX OINT: 1.0000 "application " | TOPICAL_OINTMENT | Freq: Two times a day (BID) | CUTANEOUS | 0 refills | Status: DC

## 2021-11-30 MED ORDER — CEFTRIAXONE SODIUM 500 MG IJ SOLR
500.0000 mg | Freq: Once | INTRAMUSCULAR | Status: AC
  Administered 2021-11-30: 500 mg via INTRAMUSCULAR

## 2021-11-30 MED ORDER — CEFTRIAXONE SODIUM 500 MG IJ SOLR
INTRAMUSCULAR | Status: AC
  Filled 2021-11-30: qty 500

## 2021-11-30 MED ORDER — LIDOCAINE HCL (PF) 1 % IJ SOLN
INTRAMUSCULAR | Status: AC
  Filled 2021-11-30: qty 2

## 2021-11-30 NOTE — Discharge Instructions (Addendum)
Continue to monitor for worsening spread of redness or increased warmth. ?Symptoms should improve daily. ?

## 2021-11-30 NOTE — ED Triage Notes (Signed)
Onset yesterday of possible insect bite on left posterior thigh near buttocks. Pt has been scratching the lesion. ?

## 2021-11-30 NOTE — ED Provider Notes (Signed)
?[facility] ? ? ? ?CSN: [id number] ?Arrival date & time: 11/30/21  1835 ? ? ?  ? ?History   ?Chief Complaint ?Chief Complaint  ?Patient presents with  ? Insect Bite  ? ? ?HPI ?Lindsey Wiggins is a 56 y.o. female.  ? ?HPI ?Patient presents today for evaluation of possible bug bite involving her left buttocks and thigh. She reports the area is warm, itching, and painful. Today, pain worsened. She is is here for evaluation.   ? ?Past Medical History:  ?Diagnosis Date  ? Depression   ? ? ?Patient Active Problem List  ? Diagnosis Date Noted  ? Pain in right paraspinal region 10/28/2019  ? ? ?History reviewed. No pertinent surgical history. ? ?OB History   ?No obstetric history on file. ?  ? ? ? ?Home Medications   ? ?Prior to Admission medications   ?Medication Sig Start Date End Date Taking? Authorizing Provider  ?sulfamethoxazole-trimethoprim (BACTRIM DS) 800-160 MG tablet Take 1 tablet by mouth 2 (two) times daily. 11/30/21  Yes Bing Neighbors, FNP  ?triamcinolone (KENALOG) 0.025 % ointment Apply 1 application. topically 2 (two) times daily. 11/30/21  Yes Bing Neighbors, FNP  ?ALPRAZolam (XANAX) 1 MG tablet Take 1 mg by mouth 4 (four) times daily as needed. 08/29/21   [provider]  ?buPROPion (WELLBUTRIN SR) 200 MG 12 hr tablet Take 200 mg by mouth 2 (two) times daily. 08/30/21   [provider]  ?tiZANidine (ZANAFLEX) 4 MG tablet Take 1 tablet (4 mg total) by mouth every 8 (eight) hours as needed for muscle spasms. 10/19/19   Wallis Bamberg, PA-C  ?TRINTELLIX 10 MG TABS tablet Take 10 mg by mouth daily. 06/25/21   [provider]  ? ? ?Family History ?Family History  ?Family history unknown: Yes  ? ? ?Social History ?Social History  ? ?Tobacco Use  ? Smoking status: Never  ? Smokeless tobacco: Never  ?Substance Use Topics  ? Alcohol use: No  ? Drug use: No  ? ? ? ?Allergies   ?Alpha-gal ? ? ?Review of Systems ?Review of Systems ?Pertinent negatives listed in HPI ? ?Physical  Exam ?Triage Vital Signs ?ED Triage Vitals [11/30/21 1916]  ?Enc Vitals Group  ?   BP 101/70  ?   Pulse Rate 79  ?   Resp 18  ?   Temp 98.8 ?F (37.1 ?C)  ?   Temp Source Oral  ?   SpO2 95 %  ?   Weight   ?   Height   ?   Head Circumference   ?   Peak Flow   ?   Pain Score 5  ?   Pain Loc   ?   Pain Edu?   ?   Excl. in GC?   ? ?No data found. ? ?Updated Vital Signs ?BP 101/70 (BP Location: Left Arm)   Pulse 79   Temp 98.8 ?F (37.1 ?C) (Oral)   Resp 18   SpO2 95%  ? ?Visual Acuity ?Right Eye Distance:   ?Left Eye Distance:   ?Bilateral Distance:   ? ?Right Eye Near:   ?Left Eye Near:    ?Bilateral Near:    ? ?Physical Exam ?Constitutional:   ?   Appearance: Normal appearance.  ?HENT:  ?   Head: Normocephalic and atraumatic.  ?   Nose: Nose normal.  ?Eyes:  ?   Pupils: Pupils are equal, round, and reactive to light.  ?Cardiovascular:  ?   Rate  and Rhythm: Normal rate and regular rhythm.  ?Pulmonary:  ?   Breath sounds: Normal breath sounds.  ?Musculoskeletal:  ?   Cervical back: Neck supple.  ?Skin: ?   General: Skin is warm.  ?   Findings: Erythema and wound present.  ?   Comments: Left thigh increased, with erythema no drainage induration present with expanding erythema away from wound  ?Neurological:  ?   Mental Status: She is alert.  ?Psychiatric:     ?   Attention and Perception: Attention normal.     ?   Mood and Affect: Mood normal.     ?   Speech: Speech normal.  ? ? ? ?UC Treatments / Results  ?Labs ?(all labs ordered are listed, but only abnormal results are displayed) ?Labs Reviewed - No data to display ? ?EKG ? ? ?Radiology ?No results found. ? ?Procedures ?Procedures (including critical care time) ? ?Medications Ordered in UC ?Medications  ?cefTRIAXone (ROCEPHIN) injection 500 mg (500 mg Intramuscular Given 11/30/21 2006)  ? ? ?Initial Impression / Assessment and Plan / UC Course  ?I have reviewed the triage vital signs and the nursing notes. ? ?Pertinent labs & imaging results that were available during  my care of the patient were reviewed by me and considered in my medical decision making (see chart for details). ? ?  ?Cellulitis involving the left buttocks and thigh treatment today with Bactrim twice daily for total of 10 days.  For acute itching triamcinolone cream.  Monitor site for worsening redness which could indicate a sign of a worsening infection.  Patient advised to return if any of the symptoms occur.  Complete entire course of medication. ?Final Clinical Impressions(s) / UC Diagnoses  ? ?Final diagnoses:  ?Cellulitis of buttock  ? ? ? ?Discharge Instructions   ? ?  ?Continue to monitor for worsening spread of redness or increased warmth. ?Symptoms should improve daily. ? ? ? ? ?ED Prescriptions   ? ? Medication Sig Dispense Auth. Provider  ? doxycycline (VIBRAMYCIN) 100 MG capsule  (Status: Discontinued) Take 1 capsule (100 mg total) by mouth 2 (two) times daily. 20 capsule Bing Neighbors, FNP  ? triamcinolone (KENALOG) 0.025 % ointment Apply 1 application. topically 2 (two) times daily. 60 g Bing Neighbors, FNP  ? sulfamethoxazole-trimethoprim (BACTRIM DS) 800-160 MG tablet Take 1 tablet by mouth 2 (two) times daily. 20 tablet Bing Neighbors, FNP  ? ?  ? ?PDMP not reviewed this encounter. ?  ?Bing Neighbors, FNP ?12/03/21 1311 ? ?

## 2021-12-03 ENCOUNTER — Emergency Department (HOSPITAL_COMMUNITY)
Admission: EM | Admit: 2021-12-03 | Discharge: 2021-12-03 | Disposition: A | Discharge status: Home / Self Care | Payer: Self-pay | Attending: Emergency Medicine | Admitting: Emergency Medicine

## 2021-12-03 ENCOUNTER — Encounter (HOSPITAL_COMMUNITY): Payer: Self-pay

## 2021-12-03 ENCOUNTER — Other Ambulatory Visit: Payer: Self-pay

## 2021-12-03 DIAGNOSIS — R112 Nausea with vomiting, unspecified: Secondary | ICD-10-CM | POA: Insufficient documentation

## 2021-12-03 DIAGNOSIS — L0231 Cutaneous abscess of buttock: Secondary | ICD-10-CM | POA: Insufficient documentation

## 2021-12-03 LAB — COMPREHENSIVE METABOLIC PANEL
ALT: 32 U/L (ref 0–44)
AST: 26 U/L (ref 15–41)
Albumin: 4.3 g/dL (ref 3.5–5.0)
Alkaline Phosphatase: 66 U/L (ref 38–126)
Anion gap: 10 (ref 5–15)
BUN: 15 mg/dL (ref 6–20)
CO2: 21 mmol/L — ABNORMAL LOW (ref 22–32)
Calcium: 9.8 mg/dL (ref 8.9–10.3)
Chloride: 109 mmol/L (ref 98–111)
Creatinine, Ser: 1.18 mg/dL — ABNORMAL HIGH (ref 0.44–1.00)
GFR, Estimated: 55 mL/min — ABNORMAL LOW (ref >60)
Glucose, Bld: 159 mg/dL — ABNORMAL HIGH (ref 70–99)
Potassium: 4.6 mmol/L (ref 3.5–5.1)
Sodium: 140 mmol/L (ref 135–145)
Total Bilirubin: 0.6 mg/dL (ref 0.3–1.2)
Total Protein: 8.3 g/dL — ABNORMAL HIGH (ref 6.5–8.1)

## 2021-12-03 LAB — CBC
HCT: 42.1 % (ref 36.0–46.0)
Hemoglobin: 14 g/dL (ref 12.0–15.0)
MCH: 29.6 pg (ref 26.0–34.0)
MCHC: 33.3 g/dL (ref 30.0–36.0)
MCV: 89 fL (ref 80.0–100.0)
Platelets: 287 10*3/uL (ref 150–400)
RBC: 4.73 MIL/uL (ref 3.87–5.11)
RDW: 12.4 % (ref 11.5–15.5)
WBC: 8.6 10*3/uL (ref 4.0–10.5)
nRBC: 0 % (ref 0.0–0.2)

## 2021-12-03 LAB — URINALYSIS, MICROSCOPIC (REFLEX)
Bacteria, UA: NONE SEEN
Squamous Epithelial / HPF: NONE SEEN (ref 0–5)
WBC, UA: NONE SEEN WBC/hpf (ref 0–5)

## 2021-12-03 LAB — URINALYSIS, ROUTINE W REFLEX MICROSCOPIC
Bilirubin Urine: NEGATIVE
Glucose, UA: NEGATIVE mg/dL
Ketones, ur: >80 mg/dL — AB
Leukocytes,Ua: NEGATIVE
Nitrite: NEGATIVE
Specific Gravity, Urine: >1.03 — ABNORMAL HIGH (ref 1.005–1.030)
pH: 6 (ref 5.0–8.0)

## 2021-12-03 LAB — LIPASE, BLOOD: Lipase: 31 U/L (ref 11–51)

## 2021-12-03 LAB — I-STAT BETA HCG BLOOD, ED (MC, WL, AP ONLY): I-stat hCG, quantitative: 5.7 m[IU]/mL — ABNORMAL HIGH (ref <5)

## 2021-12-03 MED ORDER — ONDANSETRON 4 MG PO TBDP
4.0000 mg | ORAL_TABLET | Freq: Once | ORAL | Status: AC | PRN
  Administered 2021-12-03: 4 mg via ORAL
  Filled 2021-12-03: qty 1

## 2021-12-03 MED ORDER — ONDANSETRON 4 MG PO TBDP: ORAL_TABLET | ORAL | 0 refills | Status: DC

## 2021-12-03 NOTE — ED Triage Notes (Signed)
Patient reports being seen at [facility] for spider bite last week. Patient was given antibiotics and steroids. Today she has been feeling nauseous and vomited. Patient has a history of migraines and has had headaches. Area of the bite is smaller but darker in color. No pain to touch.  ?

## 2021-12-03 NOTE — Discharge Instructions (Signed)
Return for rapid spreading redness or he develop a fever.  Return for inability eat or drink or worsening abdominal discomfort. ?

## 2021-12-03 NOTE — ED Provider Notes (Signed)
?[facility]-EMERGENCY DEPT ?Provider Note ? ? ?CSN: [id number] ?Arrival date & time: 12/03/21  1858 ? ?  ? ?History ? ?Chief Complaint  ?Patient presents with  ? Insect Bite  ?  spider  ? ? ?Lindsey Wiggins is a 56 y.o. female. ? ?56 yo F with a cc of nausea and vomiting.  Patient is worried this may be due to a possible insect bite to her left buttock.  She was seen in urgent care for this and started on 2 different antibiotics.  She has had some improvement of the size and pain but developed nausea and so was concerned that perhaps they were related.  She denies cough or congestion and denies abdominal pain. ? ? ? ?  ? ?Home Medications ?Prior to Admission medications   ?Medication Sig Start Date End Date Taking? Authorizing Provider  ?ALPRAZolam (XANAX) 1 MG tablet Take 1 mg by mouth 4 (four) times daily as needed. 08/29/21   [provider]  ?buPROPion (WELLBUTRIN SR) 200 MG 12 hr tablet Take 200 mg by mouth 2 (two) times daily. 08/30/21   [provider]  ?ondansetron (ZOFRAN-ODT) 4 MG disintegrating tablet 4mg  ODT q4 hours prn nausea/vomit 12/03/21   02/02/22, DO  ?sulfamethoxazole-trimethoprim (BACTRIM DS) 800-160 MG tablet Take 1 tablet by mouth 2 (two) times daily. 11/30/21   01/30/22, FNP  ?tiZANidine (ZANAFLEX) 4 MG tablet Take 1 tablet (4 mg total) by mouth every 8 (eight) hours as needed for muscle spasms. 10/19/19   10/21/19, PA-C  ?triamcinolone (KENALOG) 0.025 % ointment Apply 1 application. topically 2 (two) times daily. 11/30/21   01/30/22, FNP  ?TRINTELLIX 10 MG TABS tablet Take 10 mg by mouth daily. 06/25/21   [provider]  ?   ? ?Allergies    ?Alpha-gal   ? ?Review of Systems   ?Review of Systems ? ?Physical Exam ?Updated Vital Signs ?BP 132/88   Pulse 94   Temp 98.2 ?F (36.8 ?C) (Oral)   Resp 16   Ht 5\' 5"  (1.651 m)   SpO2 97%   BMI 28.06 kg/m?  ?Physical Exam ?Vitals and nursing note reviewed.  ?Constitutional:   ?   General:  She is not in acute distress. ?   Appearance: She is well-developed. She is not diaphoretic.  ?HENT:  ?   Head: Normocephalic and atraumatic.  ?Eyes:  ?   Pupils: Pupils are equal, round, and reactive to light.  ?Cardiovascular:  ?   Rate and Rhythm: Normal rate and regular rhythm.  ?   Heart sounds: No murmur heard. ?  No friction rub. No gallop.  ?Pulmonary:  ?   Effort: Pulmonary effort is normal.  ?   Breath sounds: No wheezing or rales.  ?Abdominal:  ?   General: There is no distension.  ?   Palpations: Abdomen is soft.  ?   Tenderness: There is no abdominal tenderness.  ?Genitourinary: ?   Comments: Small abscess to the left buttock that is actively draining.  Small amount of surrounding induration. ?Musculoskeletal:     ?   General: No tenderness.  ?   Cervical back: Normal range of motion and neck supple.  ?Skin: ?   General: Skin is warm and dry.  ?Neurological:  ?   Mental Status: She is alert and oriented to person, place, and time.  ?Psychiatric:     ?   Behavior: Behavior normal.  ? ? ?ED Results / Procedures /  Treatments   ?Labs ?(all labs ordered are listed, but only abnormal results are displayed) ?Labs Reviewed  ?COMPREHENSIVE METABOLIC PANEL - Abnormal; Notable for the following components:  ?    Result Value  ? CO2 21 (*)   ? Glucose, Bld 159 (*)   ? Creatinine, Ser 1.18 (*)   ? Total Protein 8.3 (*)   ? GFR, Estimated 55 (*)   ? All other components within normal limits  ?URINALYSIS, ROUTINE W REFLEX MICROSCOPIC - Abnormal; Notable for the following components:  ? Specific Gravity, Urine >1.030 (*)   ? Hgb urine dipstick SMALL (*)   ? Ketones, ur >80 (*)   ? Protein, ur TRACE (*)   ? All other components within normal limits  ?I-STAT BETA HCG BLOOD, ED ([facility], [facility], [facility] ONLY) - Abnormal; Notable for the following components:  ? I-stat hCG, quantitative 5.7 (*)   ? All other components within normal limits  ?LIPASE, BLOOD  ?CBC  ?URINALYSIS, MICROSCOPIC (REFLEX)  ? ? ?EKG ?None ? ?Radiology ?No results  found. ? ?Procedures ?Procedures  ? ? ?Medications Ordered in ED ?Medications  ?ondansetron (ZOFRAN-ODT) disintegrating tablet 4 mg (4 mg Oral Given 12/03/21 1919)  ? ? ?ED Course/ Medical Decision Making/ A&P ?  ?                        ?Medical Decision Making ?Risk ?Prescription drug management. ? ? ?56 yo F with a chief complaints of an abscess to her left buttock and nausea and vomiting.  By history the seem to be unrelated.  Her abscess clinically has been getting better progressively.  It could be an antibiotic reaction the nausea that she is having or could be a different syndrome altogether.  She has no symptoms consistent of esophagitis from doxycycline use.  She had lab work done today that is reassuring, no leukocytosis no anemia no significant electrolyte abnormality.  UA negative for infection.  Ketones in the urine which is consistent with her history of nausea and vomiting today.  She was given Zofran in the waiting room and feels much better.  Has been able to tolerate by mouth.  I did offer to perform an I&D the abscess, risk and benefits and elected to continue antibiotic therapy and observation.  We will have her follow-up with her family doctor in the office. ? ?8:56 PM:  I have discussed the diagnosis/risks/treatment options with the patient and family.  Evaluation and diagnostic testing in the emergency department does not suggest an emergent condition requiring admission or immediate intervention beyond what has been performed at this time.  They will follow up with  PCP. We also discussed returning to the ED immediately if new or worsening sx occur. We discussed the sx which are most concerning (e.g., sudden worsening pain, fever, inability to tolerate by mouth) that necessitate immediate return. Medications administered to the patient during their visit and any new prescriptions provided to the patient are listed below. ? ?Medications given during this visit ?Medications  ?ondansetron  (ZOFRAN-ODT) disintegrating tablet 4 mg (4 mg Oral Given 12/03/21 1919)  ? ? ? ?The patient appears reasonably screen and/or stabilized for discharge and I doubt any other medical condition or other [facility] requiring further screening, evaluation, or treatment in the ED at this time prior to discharge.  ? ? ? ? ? ? ? ? ?Final Clinical Impression(s) / ED Diagnoses ?Final diagnoses:  ?Left buttock abscess  ?Nausea and vomiting in  adult  ? ? ?Rx / DC Orders ?ED Discharge Orders   ? ?      Ordered  ?  ondansetron (ZOFRAN-ODT) 4 MG disintegrating tablet  Status:  Discontinued       ? 12/03/21 2047  ?  ondansetron (ZOFRAN-ODT) 4 MG disintegrating tablet       ? 12/03/21 2054  ? ?  ?  ? ?  ? ? ?  ?Melene PlanFloyd, Koy Lamp, DO ?12/03/21 2057 ? ?

## 2022-10-14 ENCOUNTER — Other Ambulatory Visit: Payer: Self-pay | Admitting: Internal Medicine

## 2022-10-14 DIAGNOSIS — Z1231 Encounter for screening mammogram for malignant neoplasm of breast: Secondary | ICD-10-CM

## 2023-01-16 ENCOUNTER — Ambulatory Visit: Payer: Self-pay

## 2023-08-14 ENCOUNTER — Ambulatory Visit (HOSPITAL_COMMUNITY)
Admission: EM | Admit: 2023-08-14 | Discharge: 2023-08-14 | Disposition: A | Discharge status: Home / Self Care | Payer: Self-pay | Attending: Emergency Medicine | Admitting: Emergency Medicine

## 2023-08-14 ENCOUNTER — Encounter (HOSPITAL_COMMUNITY): Payer: Self-pay

## 2023-08-14 DIAGNOSIS — Z711 Person with feared health complaint in whom no diagnosis is made: Secondary | ICD-10-CM | POA: Diagnosis not present

## 2023-08-14 NOTE — ED Triage Notes (Signed)
Patient reports that she had high cholesterol in the summer of 2024 and did not follow up.  Patient states that she had tingling and pain in her left arm 2 nights ago and none since. Patient denies having chest pain.   Patient states, "I have a stressful job, but I do have a father who died in his 79's from a heart attack.

## 2023-08-14 NOTE — Discharge Instructions (Signed)
Your EKG today is completely normal, there is no concern for heart disease at this time.  Thank you for visiting [facility] today.

## 2023-08-14 NOTE — ED Provider Notes (Signed)
[facility]    CSN: [id number] Arrival date & time: 08/14/23  1037    HISTORY  No chief complaint on file.  HPI Lindsey Wiggins is a pleasant, 58 y.o. female who presents to urgent care today. Patient states that 2 days ago she felt a dull pain on the backside of her upper left arm and tingling in her left hands and fingers.  Patient states that this is not reoccurred.  Patient states that the next night she had a small amount of pain in her left lower chest not accompanied by heartburn or belching, denies pressure, shortness of breath.  Patient endorses having a stressful job and a family history positive for fetal MI in her father and generalized anxiety disorder with recurrent major depressive disorder.  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  Patient is well-appearing on arrival, appears to be in no acute distress.    Past Medical History:  Diagnosis Date   Depression    Patient Active Problem List   Diagnosis Date Noted   Pain in right paraspinal region 10/28/2019   History reviewed. No pertinent surgical history. OB History   No obstetric history on file.    Home Medications    Prior to Admission medications   Medication Sig Start Date End Date Taking? Authorizing Provider  ALPRAZolam Prudy Feeler) 1 MG tablet Take 1 mg by mouth 4 (four) times daily as needed. 08/29/21   [provider]  buPROPion (WELLBUTRIN SR) 200 MG 12 hr tablet Take 200 mg by mouth 2 (two) times daily. 08/30/21   [provider]  ondansetron (ZOFRAN-ODT) 4 MG disintegrating tablet 4mg  ODT q4 hours prn nausea/vomit 12/03/21   Melene Plan, DO  sulfamethoxazole-trimethoprim (BACTRIM DS) 800-160 MG tablet Take 1 tablet by mouth 2 (two) times daily. 11/30/21   Bing Neighbors, NP  tiZANidine (ZANAFLEX) 4 MG tablet Take 1 tablet (4 mg total) by  mouth every 8 (eight) hours as needed for muscle spasms. 10/19/19   Wallis Bamberg, PA-C  triamcinolone (KENALOG) 0.025 % ointment Apply 1 application. topically 2 (two) times daily. 11/30/21   Bing Neighbors, NP  TRINTELLIX 10 MG TABS tablet Take 10 mg by mouth daily. 06/25/21   [provider]    Family History Family History  Problem Relation Age of Onset   Hypertension Mother    Heart attack Father    Social History Social History   Tobacco Use   Smoking status: Never   Smokeless tobacco: Never  Vaping Use   Vaping status: Never Used  Substance Use Topics   Alcohol use: No   Drug use: No   Allergies   Alpha-gal  Review of Systems Review of Systems Pertinent findings revealed after performing a 14 point review of systems has been noted in the history of present illness.  Physical Exam Vital Signs BP (!) 93/43 (BP Location: Right Arm)   Pulse 75   Temp 98 F (36.7 C) (Oral)   Resp 16   SpO2 94%   No data found.  Physical Exam Vitals and nursing note reviewed.  Constitutional:      General: She is not in acute distress.    Appearance: Normal appearance.  HENT:     Head: Normocephalic and atraumatic.  Eyes:     Pupils: Pupils are equal, round,  and reactive to light.  Cardiovascular:     Rate and Rhythm: Normal rate and regular rhythm.  Pulmonary:     Effort: Pulmonary effort is normal.     Breath sounds: Normal breath sounds.  Musculoskeletal:        General: Normal range of motion.     Cervical back: Normal range of motion and neck supple.  Skin:    General: Skin is warm and dry.  Neurological:     General: No focal deficit present.     Mental Status: She is alert and oriented to person, place, and time. Mental status is at baseline.  Psychiatric:        Mood and Affect: Mood normal.        Behavior: Behavior normal.        Thought Content: Thought content normal.        Judgment: Judgment normal.     Visual Acuity Right Eye Distance:    Left Eye Distance:   Bilateral Distance:    Right Eye Near:   Left Eye Near:    Bilateral Near:     UC Couse / Diagnostics / Procedures:     Radiology No results found.  Procedures Procedures (including critical care time) EKG  Pending results:  Labs Reviewed - No data to display  Medications Ordered in UC: Medications - No data to display  UC Diagnoses / Final Clinical Impressions(s)   I have reviewed the triage vital signs and the nursing notes.  Pertinent labs & imaging results that were available during my care of the patient were reviewed by me and considered in my medical decision making (see chart for details).    Final diagnoses:  Worried well   Patient advised of abnormal EKG findings.  Conservative care recommended.  Return precautions advised.  Please see discharge instructions below for details of plan of care as provided to patient. ED Prescriptions   None    PDMP not reviewed this encounter.  Pending results:  Labs Reviewed - No data to display    Discharge Instructions      Your EKG today is completely normal, there is no concern for heart disease at this time.  Thank you for visiting [facility] today.      Disposition Upon Discharge:  Condition: stable for discharge home  Patient presented with an acute illness with associated systemic symptoms and significant discomfort requiring urgent management. In my opinion, this is a condition that a prudent lay person (someone who possesses an average knowledge of health and medicine) may potentially expect to result in complications if not addressed urgently such as respiratory distress, impairment of bodily function or dysfunction of bodily organs.   Routine symptom specific, illness specific and/or disease specific instructions were discussed with the patient and/or caregiver at length.   As such, the patient has been evaluated and assessed, work-up was performed and treatment  was provided in alignment with urgent care protocols and evidence based medicine.  Patient/parent/caregiver has been advised that the patient may require follow up for further testing and treatment if the symptoms continue in spite of treatment, as clinically indicated and appropriate.  Patient/parent/caregiver has been advised to return to the [facility] or PCP if no better; to PCP or the Emergency Department if new signs and symptoms develop, or if the current signs or symptoms continue to change or worsen for further workup, evaluation and treatment as clinically indicated and appropriate  The patient will follow up with  their current PCP if and as advised. If the patient does not currently have a PCP we will assist them in obtaining one.   The patient may need specialty follow up if the symptoms continue, in spite of conservative treatment and management, for further workup, evaluation, consultation and treatment as clinically indicated and appropriate.  Patient/parent/caregiver verbalized understanding and agreement of plan as discussed.  All questions were addressed during visit.  Please see discharge instructions below for further details of plan.  This office note has been dictated using Teaching laboratory technician.  Unfortunately, this method of dictation can sometimes lead to typographical or grammatical errors.  I apologize for your inconvenience in advance if this occurs.  Please do not hesitate to reach out to me if clarification is needed.      Theadora Rama Scales, PA-C 08/14/23 1705
